# Patient Record
Sex: Male | Born: 1945 | Race: White | Hispanic: No | Marital: Married
Health system: Southern US, Community
[De-identification: ages and names within clinical notes are randomized; demographics above are authoritative.]

## PROBLEM LIST (undated history)

## (undated) DIAGNOSIS — C801 Malignant (primary) neoplasm, unspecified: Secondary | ICD-10-CM

## (undated) DIAGNOSIS — J449 Chronic obstructive pulmonary disease, unspecified: Secondary | ICD-10-CM

---

## 2004-09-29 ENCOUNTER — Emergency Department: Payer: Self-pay | Admitting: Unknown Physician Specialty

## 2007-05-22 ENCOUNTER — Emergency Department: Payer: Self-pay | Admitting: Emergency Medicine

## 2007-11-28 ENCOUNTER — Emergency Department: Payer: Self-pay | Admitting: Unknown Physician Specialty

## 2011-02-16 ENCOUNTER — Inpatient Hospital Stay: Payer: Self-pay | Admitting: Student

## 2013-06-13 IMAGING — CT CT CHEST W/ CM
1 series · 15 of 32 positions shown, 19 images · non-contrast
Comparison: None

REASON FOR EXAM: low  o2  sat
COMMENTS:

PROCEDURE:     CT  - CT CHEST (FOR PE) W  - February 15, 2011 [DATE]
RESULT:     Indications: Hypoxia
TECHNIQUE: A thin-section spiral CT from the lung apices to the upper
abdomen was acquired on a multi slice scanner following 100ml 4sovue-HAS
intravenous contrast. These images were then transferred to the Siemens work
station and were subsequently reviewed utilizing 3-D reconstructions and MIP
images.

[Series 4: soft tissue · axial · 0.84mm/px · z∈[-303,-21]mm · 15 of 106 slices shown, 19 images]
[im 8/106  mediastinal]
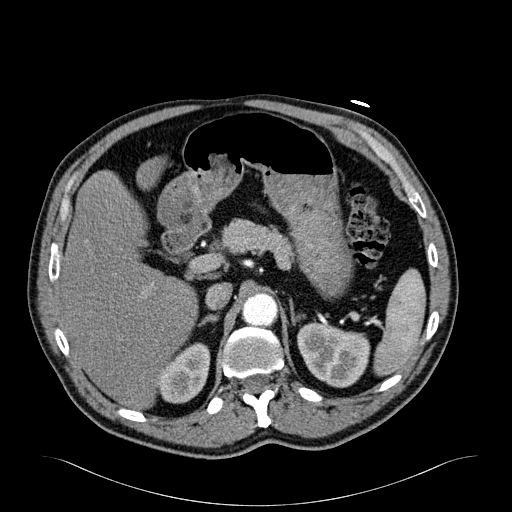
[im 8/106  lung]
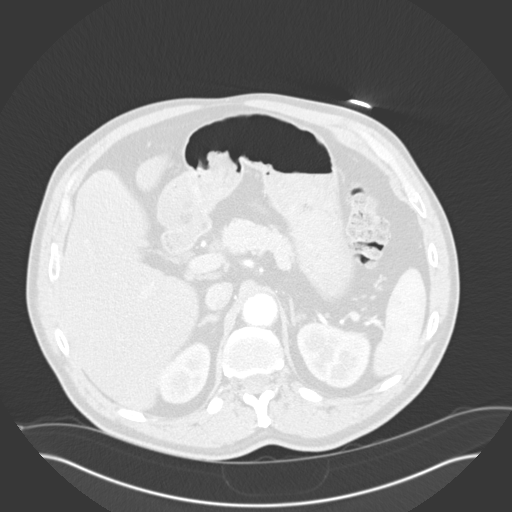
[im 16/106  lung]
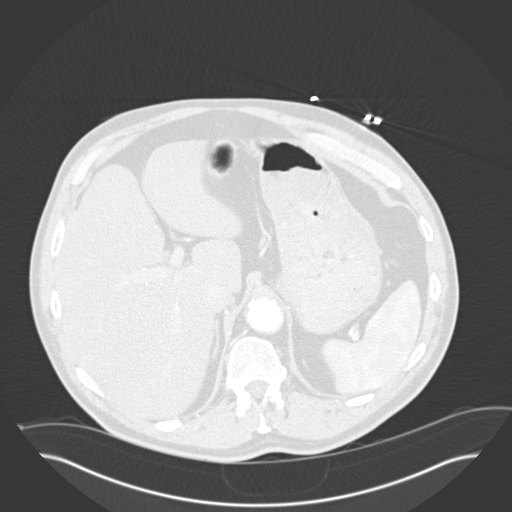
[im 22/106  lung]
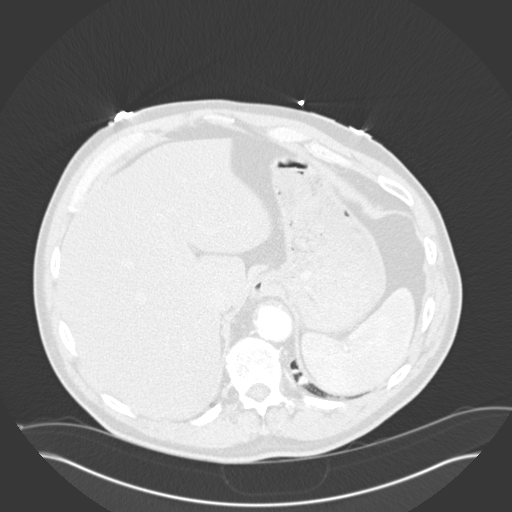
[im 28/106  lung]
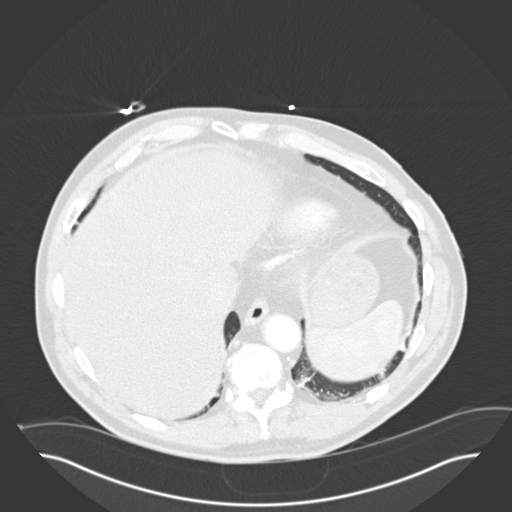
[im 36/106  mediastinal]
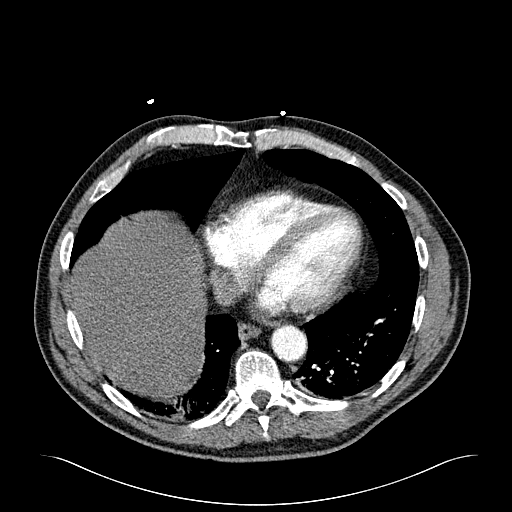
[im 36/106  lung]
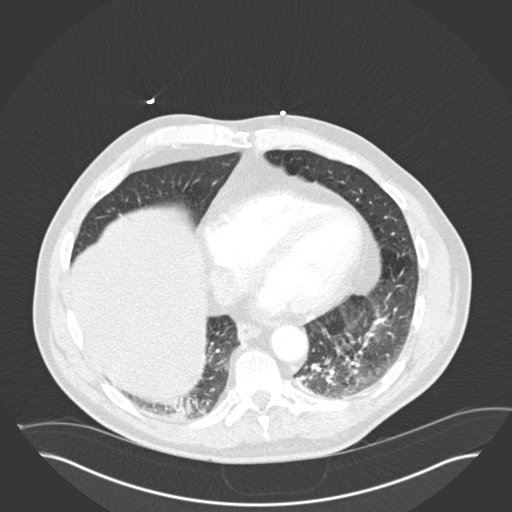
[im 43/106  lung]
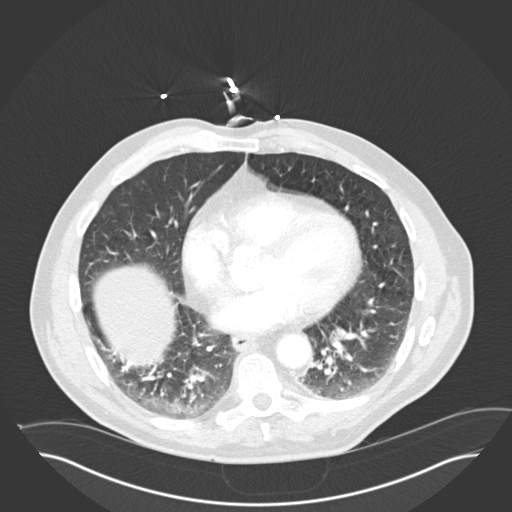
[im 51/106  lung]
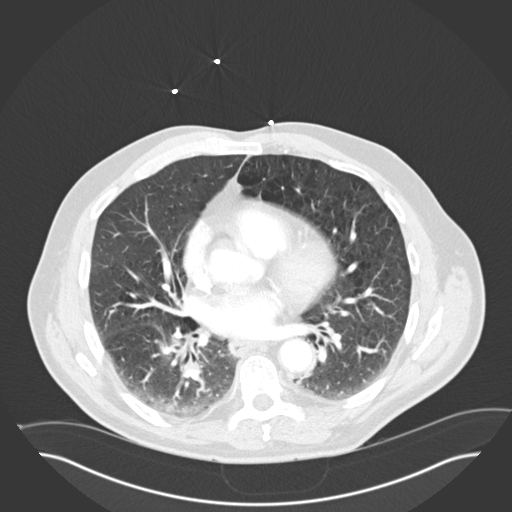
[im 56/106  lung]
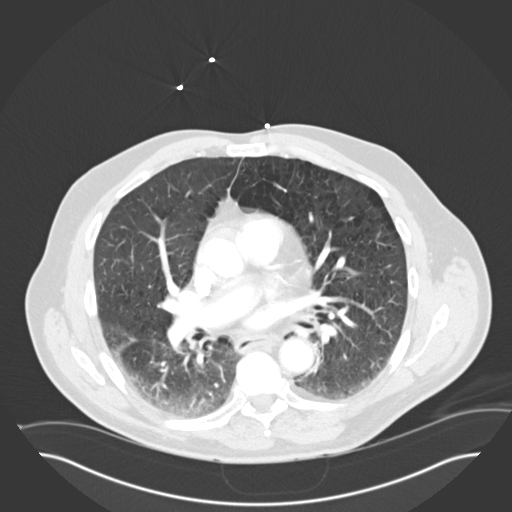
[im 63/106  mediastinal]
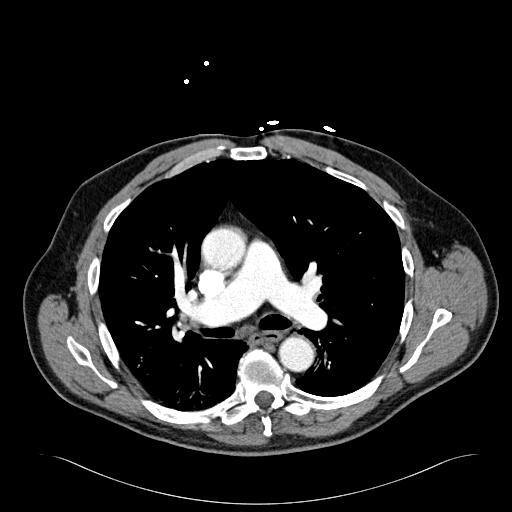
[im 63/106  lung]
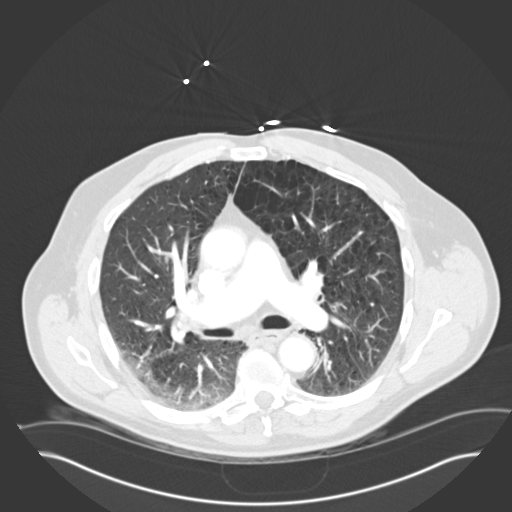
[im 67/106  lung]
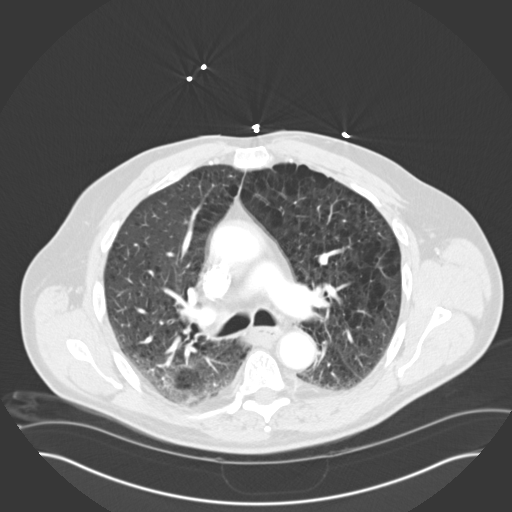
[im 74/106  lung]
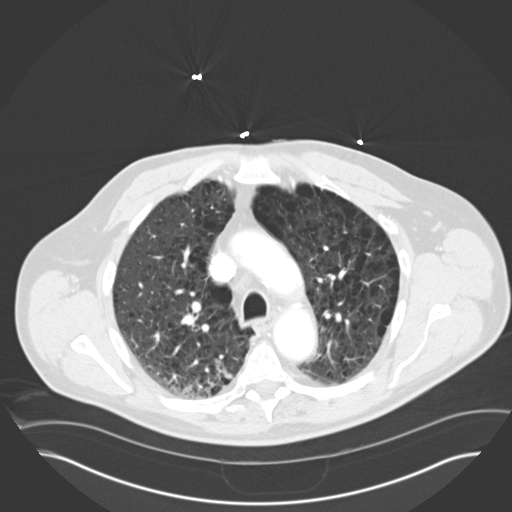
[im 82/106  lung]
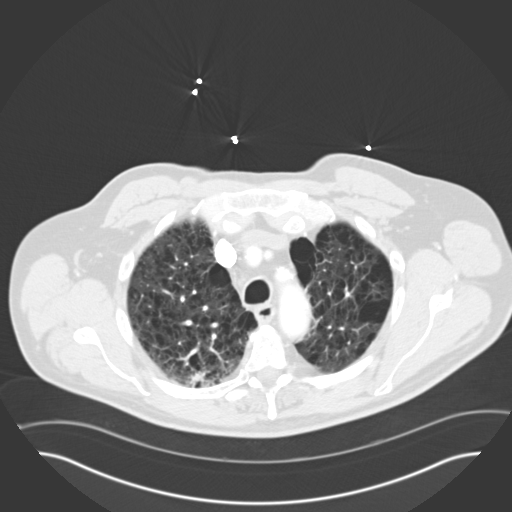
[im 86/106  mediastinal]
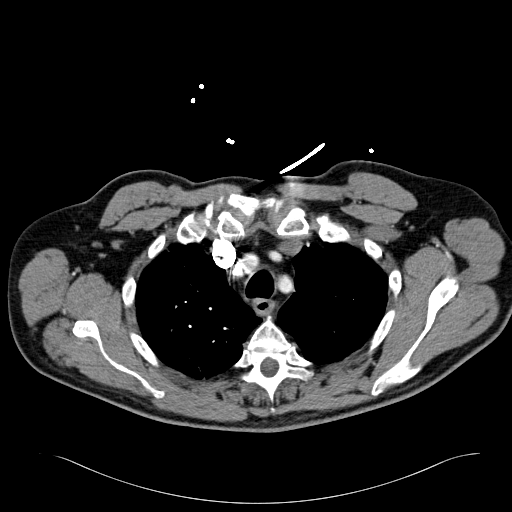
[im 86/106  lung]
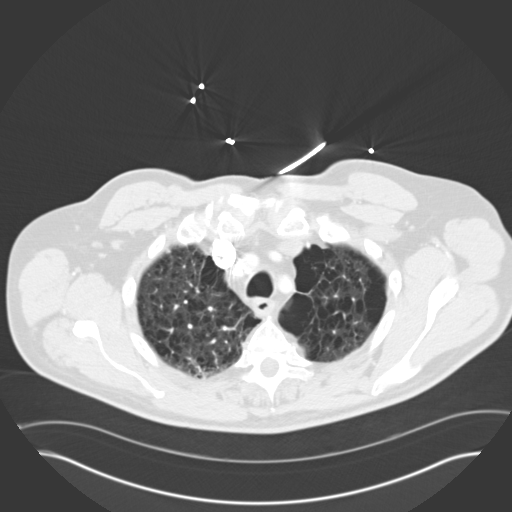
[im 94/106  lung]
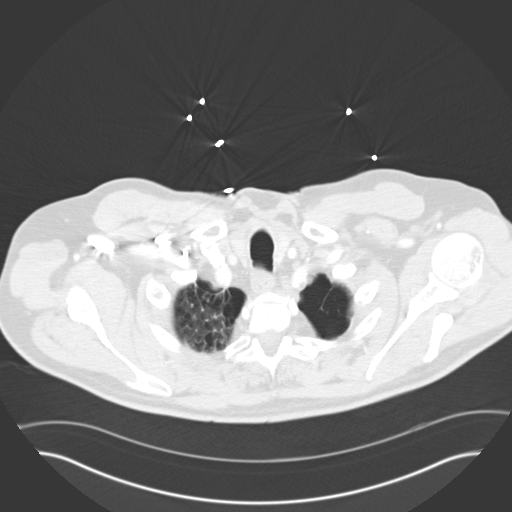
[im 102/106  lung]
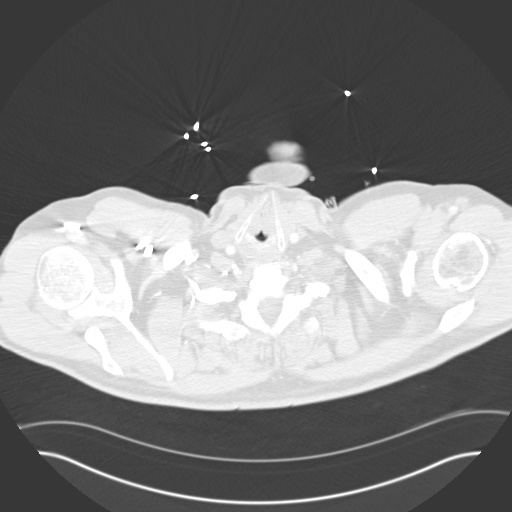

[15 of 32 positions shown; findings below may reference images not displayed]

FINDINGS: There is adequate opacification of the pulmonary arteries. There is no
pulmonary embolus. There is dilatation of the right main pulmonary artery.
The heart size is normal. There is no pericardial effusion. There is
coronary artery atherosclerosis involving the LAD, circumflex and right
coronary artery.

There is severe bilateral emphysematous changes. The there is a 2-3 mm left
upper lobe pulmonary nodule on image 38. There is no focal consolidation,
pleural effusion, or pneumothorax.

There is no axillary, hilar, or mediastinal adenopathy.

The osseous structures are unremarkable.

The liver is low-attenuation likely secondary to hepatic steatosis.
IMPRESSION: 1. No CT evidence of pulmonary embolus.

2. Severe bilateral emphysematous changes.

3. Coronary artery atherosclerosis.

4. There is a 2 -3 mm left upper lobe pulmonary nodule.

## 2016-04-05 ENCOUNTER — Emergency Department
Admission: EM | Admit: 2016-04-05 | Discharge: 2016-04-07 | Disposition: E | Payer: Medicare Other | Attending: Emergency Medicine | Admitting: Emergency Medicine

## 2016-04-05 ENCOUNTER — Encounter: Payer: Self-pay | Admitting: Emergency Medicine

## 2016-04-05 DIAGNOSIS — I469 Cardiac arrest, cause unspecified: Secondary | ICD-10-CM | POA: Insufficient documentation

## 2016-04-05 DIAGNOSIS — J449 Chronic obstructive pulmonary disease, unspecified: Secondary | ICD-10-CM | POA: Insufficient documentation

## 2016-04-05 HISTORY — DX: Malignant (primary) neoplasm, unspecified: C80.1

## 2016-04-05 HISTORY — DX: Chronic obstructive pulmonary disease, unspecified: J44.9

## 2016-04-05 LAB — CBC WITH DIFFERENTIAL/PLATELET
BASOS ABS: 0.1 10*3/uL (ref 0–0.1)
BASOS PCT: 1 %
Eosinophils Absolute: 0.4 10*3/uL (ref 0–0.7)
Eosinophils Relative: 2 %
HEMATOCRIT: 36.2 % — AB (ref 40.0–52.0)
HEMOGLOBIN: 11.7 g/dL — AB (ref 13.0–18.0)
Lymphocytes Relative: 42 %
Lymphs Abs: 7.4 10*3/uL — ABNORMAL HIGH (ref 1.0–3.6)
MCH: 28.6 pg (ref 26.0–34.0)
MCHC: 32.4 g/dL (ref 32.0–36.0)
MCV: 88.2 fL (ref 80.0–100.0)
Monocytes Absolute: 0.8 10*3/uL (ref 0.2–1.0)
Monocytes Relative: 4 %
NEUTROS ABS: 8.8 10*3/uL — AB (ref 1.4–6.5)
NEUTROS PCT: 51 %
Platelets: 452 10*3/uL — ABNORMAL HIGH (ref 150–440)
RBC: 4.11 MIL/uL — AB (ref 4.40–5.90)
RDW: 17.8 % — ABNORMAL HIGH (ref 11.5–14.5)
WBC: 17.4 10*3/uL — ABNORMAL HIGH (ref 3.8–10.6)

## 2016-04-05 LAB — GLUCOSE, CAPILLARY: GLUCOSE-CAPILLARY: 218 mg/dL — AB (ref 65–99)

## 2016-04-05 LAB — COMPREHENSIVE METABOLIC PANEL
ALBUMIN: 3.9 g/dL (ref 3.5–5.0)
ALT: 44 U/L (ref 17–63)
ANION GAP: 14 (ref 5–15)
AST: 43 U/L — ABNORMAL HIGH (ref 15–41)
Alkaline Phosphatase: 48 U/L (ref 38–126)
BUN: 14 mg/dL (ref 6–20)
CHLORIDE: 103 mmol/L (ref 101–111)
CO2: 15 mmol/L — AB (ref 22–32)
Calcium: 9.1 mg/dL (ref 8.9–10.3)
Creatinine, Ser: 1.02 mg/dL (ref 0.61–1.24)
GFR calc non Af Amer: >60 mL/min (ref >60)
Glucose, Bld: 260 mg/dL — ABNORMAL HIGH (ref 65–99)
POTASSIUM: 3.3 mmol/L — AB (ref 3.5–5.1)
SODIUM: 132 mmol/L — AB (ref 135–145)
Total Bilirubin: 0.4 mg/dL (ref 0.3–1.2)
Total Protein: 7.3 g/dL (ref 6.5–8.1)

## 2016-04-05 LAB — CK: CK TOTAL: 71 U/L (ref 49–397)

## 2016-04-05 LAB — TROPONIN I: Troponin I: 0.29 ng/mL (ref <0.03)

## 2016-04-05 MED ORDER — EPINEPHRINE PF 1 MG/10ML IJ SOSY
PREFILLED_SYRINGE | INTRAMUSCULAR | Status: AC | PRN
  Administered 2016-04-05 (×6): 1 mg via INTRAVENOUS

## 2016-04-05 MED ORDER — EPINEPHRINE PF 1 MG/10ML IJ SOSY
PREFILLED_SYRINGE | INTRAMUSCULAR | Status: AC
  Filled 2016-04-05: qty 30

## 2016-04-05 MED ORDER — SODIUM CHLORIDE 0.9 % IV SOLN
INTRAVENOUS | Status: AC | PRN
  Administered 2016-04-05: 1000 mL via INTRAVENOUS

## 2016-04-05 MED FILL — Medication: Qty: 1 | Status: AC

## 2016-04-05 NOTE — Progress Notes (Signed)
The Nurse paged [facility] for Pt in ED Rm 10. Pt was unresponsive, medical team performed CPR until the wife asked them to stop, passed at 9:05pm. [place] spent time with family and provided ministry of compassion, comfort and prayer.    03/18/2016 2200  Clinical Encounter Type  Visited With Patient;Patient and family together  Visit Type Initial  Referral From Nurse  Consult/Referral To Chaplain  Spiritual Encounters  Spiritual Needs Prayer;Other (Comment)

## 2016-04-05 NOTE — ED Provider Notes (Addendum)
[facility] Department of Emergency Medicine    Chief Complaint: Cardiac arrest/unresponsive   Level V Caveat: Unresponsive  Patient brought by EMS, had sudden onset worsening to severe dyspnea. Treated aggressively with nebulizers, patient was reporting improvement EMS, and staff report that the patient appeared alert on arrival but as soon as the patient was being wheeled into the room he went rapidly unresponsive. I arrived to the room approximately 30 seconds later  ROS: Unable to obtain, Level V caveat   Past Medical History:  Diagnosis Date  . Cancer ([place])   . COPD (chronic obstructive pulmonary disease) ([place])    History reviewed. No pertinent surgical history. Social History   Social History  . Marital status: Married    Spouse name: N/A  . Number of children: N/A  . Years of education: N/A   Occupational History  . Not on file.   Social History Main Topics  . Smoking status: Unknown If Ever Smoked  . Smokeless tobacco: Not on file  . Alcohol use Not on file  . Drug use: Unknown  . Sexual activity: Not on file   Other Topics Concern  . Not on file   Social History Narrative  . No narrative on file   Not on File  Last set of Vital Signs (not current) Vitals:   03/21/2016 2059  BP: 118/88  Pulse: (!) 108  Resp: (!) 25      Physical Exam  Gen: unresponsive Cardiovascular: pulseless And apneic - CPR started immediately Resp: apneic. Breath sounds equal bilaterally with bagging  Abd: nondistended  Neuro: GCS 3, unresponsive to pain  HEENT: No blood in posterior pharynx, gag reflex absent  Neck: No crepitus  Musculoskeletal: No deformity  Skin: warm  Procedures  INTUBATION Performed by: Delman Kitten Required items: required blood products, implants, devices, and special equipment available Patient identity confirmed: provided demographic data and hospital-assigned identification number Time out: Immediately prior to procedure  a "time out" was called to verify the correct patient, procedure, equipment, support staff and site/side marked as required. Indications: Apnea  Intubation method: Video laryngoscopy Preoxygenation: BVM Sedatives: None  Paralytic: None  Tube Size: 7.5 cuffed Post-procedure assessment: chest rise and ETCO2 monitor, excellent waveform Square with end-tidal in the mid 30s.  Breath sounds: equal and absent over the epigastrium Tube secured by Respiratory Therapy Patient tolerated the procedure well with no immediate complications.  CRITICAL CARE Performed by: Delman Kitten Total critical care time: 20 Critical care time was exclusive of separately billable procedures and treating other patients. Critical care was necessary to treat or prevent imminent or life-threatening deterioration. Critical care was time spent personally by me on the following activities: development of treatment plan with patient and/or surrogate as well as nursing, discussions with consultants, evaluation of patient's response to treatment, examination of patient, obtaining history from patient or surrogate, ordering and performing treatments and interventions, ordering and review of laboratory studies, ordering and review of radiographic studies, pulse oximetry and re-evaluation of patient's condition.  Cardiopulmonary Resuscitation (CPR) Procedure Note 30 Directed/Performed by: Delman Kitten I personally directed ancillary staff and/or performed CPR in an effort to regain return of spontaneous circulation and to maintain cardiac, neuro and systemic perfusion.    Medical Decision making  Patient presents with acute respiratory distress, taken directly by EMS to treatment room 10. He had a witnessed unresponsiveness, noted in full cardiopulmonary arrest with underlying PE a rhythm. Patient was treated aggressively with high quality CPR  Assessment and  Plan  Patient presents in full cardiopulmonary arrest. Differential  diagnosis is broad, but certainly focused upon reversible causes. Patient PEA.   Patient received 30 minutes of continuous CPR with only brief 5 second pauses with no return of spontaneous circulation, pulseless electrical activity throughout. Patient noted to have fixed pupillary responses, apnea, and downtrending end-tidal CO2 despite resuscitation efforts. After 30 minutes of continuous CPR, no return of circulation, and nonperfusion decision made to terminate efforts. The patient had slightly elevated glucose, his lung sounds are clear bilaterally and he bagged well and easily making pneumothorax extremely unlikely.  Patient clinically clinically dead by myself. Patient was apneic, pulseless, no pupillary responses. No spontaneous movements or activities. Agonal rhythm, degraded into asystole.  Patient's wife, daughter at bedside. Chaplain at bedside, provided family care.      Delman Kitten, MD 03/25/2016 2228   ----------------------------------------- 11:42 PM on 03/20/2016 -----------------------------------------  Discussed with [facility] family medicine on-call, Roselee Culver, notes that she will send a message to patient's primary doctor Purvis Kilts), and death certificate should be sent to [facility] family medicine for signing.    Delman Kitten, MD 03/07/2016 US:[id number]    Delman Kitten, MD 03/22/2016 [phone]

## 2016-04-05 NOTE — Code Documentation (Signed)
Pt's wife and daughter at bedside. Pt's wife stating "Please stop. Please don't hurt him no more. Please don't hurt him no more." EDP present, compressions stopped per family request and EDP order.

## 2016-04-05 NOTE — ED Notes (Signed)
Family given personal belongings from patient including wallet, 2 sets of keys, small pocket knife and 50 cent coin.

## 2016-04-05 NOTE — Code Documentation (Signed)
Pt presents to ED via [facility] from home. EMS report they were initially called out by pt's family for shortness of breath. Given 125 Solu-Medrol and x2 duoneb treatments PTA. P140-145, BP 190/100 with EMS. Hx COPD, recently diagnosed with CA, unsure what type. Upon arrival to ED pt became unresponsive with no pulse, PEA on monitor. CPR started. EDP present at bedside.

## 2016-04-05 NOTE — Code Documentation (Signed)
Time of death called 2105 per Dr. Jacqualine Code.

## 2016-04-05 NOTE — ED Notes (Signed)
Family stated all family members were able to see patient and were going home.

## 2016-04-07 DEATH — deceased
